# Patient Record
Sex: Female | Born: 1990 | Race: White | Hispanic: No | Marital: Single | Smoking: Never smoker
Health system: Southern US, Community
[De-identification: ages and names within clinical notes are randomized; demographics above are authoritative.]

## PROBLEM LIST (undated history)

## (undated) DIAGNOSIS — J45909 Unspecified asthma, uncomplicated: Secondary | ICD-10-CM

## (undated) DIAGNOSIS — R51 Headache: Secondary | ICD-10-CM

## (undated) DIAGNOSIS — S62609A Fracture of unspecified phalanx of unspecified finger, initial encounter for closed fracture: Secondary | ICD-10-CM

## (undated) HISTORY — PX: TYMPANOSTOMY TUBE PLACEMENT: SHX32

## (undated) HISTORY — PX: WISDOM TOOTH EXTRACTION: SHX21

---
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
## 2012-09-22 ENCOUNTER — Other Ambulatory Visit: Payer: Self-pay | Admitting: Otolaryngology

## 2012-09-22 DIAGNOSIS — J343 Hypertrophy of nasal turbinates: Secondary | ICD-10-CM

## 2012-10-10 ENCOUNTER — Ambulatory Visit
Admission: RE | Admit: 2012-10-10 | Discharge: 2012-10-10 | Disposition: A | Discharge status: Home / Self Care | Payer: BC Managed Care – PPO | Source: Ambulatory Visit | Attending: Otolaryngology | Admitting: Otolaryngology

## 2012-10-10 DIAGNOSIS — J343 Hypertrophy of nasal turbinates: Secondary | ICD-10-CM

## 2013-09-05 ENCOUNTER — Encounter (HOSPITAL_BASED_OUTPATIENT_CLINIC_OR_DEPARTMENT_OTHER): Payer: Self-pay | Admitting: *Deleted

## 2013-09-05 ENCOUNTER — Other Ambulatory Visit: Payer: Self-pay | Admitting: Orthopedic Surgery

## 2013-09-05 NOTE — H&P (Signed)
  [name] is an 23 y.o. female.   Chief Complaint: c/o injury to her right long finger ZOX:WRUEAVWHPI:Patient is a 23 y/o female who sustained an injury to her right long finger on 09/01/13 when she was leading her horse. She noted continued swelling and pain of the digit. She now presents to our office for evaluation and treatment.  Past Medical History  Diagnosis Date  . Headache(784.0)   . Fracture, finger     right long finger  . Asthma     as child    Past Surgical History  Procedure Laterality Date  . Tympanostomy tube placement    . Wisdom tooth extraction      History reviewed. No pertinent family history. Social History:  reports that she has never smoked. She has never used smokeless tobacco. She reports that she drinks alcohol. Her drug history is not on file.  Allergies:  Allergies  Allergen Reactions  . Suprax [Cefixime] Rash    No prescriptions prior to admission    No results found for this or any previous visit (from the past 48 hour(s)).  No results found.   Pertinent items are noted in HPI.  Height 5\' 9"  (1.753 m), weight 68.04 kg (150 lb), last menstrual period 08/30/2013.  General appearance: alert Head: Normocephalic, without obvious abnormality Neck: supple, symmetrical, trachea midline Resp: clear to auscultation bilaterally Cardio: regular rate and rhythm GI: normal findings: bowel sounds normal Extremities:Exam of the right long finger reveals swelling of the middle phalanx.There is no mal-alignment distally. N/V is intact. X-rays revealed a long spiral oblique fracture of the middle phalanx that is slightly displaced Pulses: 2+ and symmetric Skin: normal Neurologic: Grossly normal    Assessment/Plan Impression: Right long finger middle phalanx fracture, displaced, unstable and causing extensor lag at DIP joint.  Plan: To the OR for ORIF right long finger middle phalanx fracture.The procedure, risks,benefits and post-op course were  discussed with the patient at length and they were in agreement with the plan.  DASNOIT,Amilyah Nack J 09/05/2013, 3:57 PM  H&P documentation: 09/06/2013  -History and Physical Reviewed  -Patient has been re-examined  -No change in the plan of care  Wyn Forsterobert V Lakeithia Rasor Jr, MD

## 2013-09-06 ENCOUNTER — Ambulatory Visit (HOSPITAL_BASED_OUTPATIENT_CLINIC_OR_DEPARTMENT_OTHER)
Admission: RE | Admit: 2013-09-06 | Discharge: 2013-09-06 | Disposition: A | Discharge status: Home / Self Care | Payer: BC Managed Care – PPO | Source: Ambulatory Visit | Attending: Orthopedic Surgery | Admitting: Orthopedic Surgery

## 2013-09-06 ENCOUNTER — Encounter (HOSPITAL_BASED_OUTPATIENT_CLINIC_OR_DEPARTMENT_OTHER): Payer: BC Managed Care – PPO | Admitting: Anesthesiology

## 2013-09-06 ENCOUNTER — Ambulatory Visit (HOSPITAL_BASED_OUTPATIENT_CLINIC_OR_DEPARTMENT_OTHER): Payer: BC Managed Care – PPO | Admitting: Anesthesiology

## 2013-09-06 ENCOUNTER — Encounter (HOSPITAL_BASED_OUTPATIENT_CLINIC_OR_DEPARTMENT_OTHER)
Admission: RE | Disposition: A | Discharge status: Home / Self Care | Payer: Self-pay | Source: Ambulatory Visit | Attending: Orthopedic Surgery

## 2013-09-06 ENCOUNTER — Encounter (HOSPITAL_BASED_OUTPATIENT_CLINIC_OR_DEPARTMENT_OTHER): Payer: Self-pay | Admitting: *Deleted

## 2013-09-06 DIAGNOSIS — IMO0002 Reserved for concepts with insufficient information to code with codable children: Secondary | ICD-10-CM | POA: Insufficient documentation

## 2013-09-06 DIAGNOSIS — X58XXXA Exposure to other specified factors, initial encounter: Secondary | ICD-10-CM | POA: Insufficient documentation

## 2013-09-06 DIAGNOSIS — R51 Headache: Secondary | ICD-10-CM | POA: Insufficient documentation

## 2013-09-06 HISTORY — DX: Headache: R51

## 2013-09-06 HISTORY — PX: OPEN REDUCTION INTERNAL FIXATION (ORIF) PROXIMAL PHALANX: SHX6235

## 2013-09-06 HISTORY — DX: Unspecified asthma, uncomplicated: J45.909

## 2013-09-06 HISTORY — DX: Fracture of unspecified phalanx of unspecified finger, initial encounter for closed fracture: S62.609A

## 2013-09-06 SURGERY — OPEN REDUCTION INTERNAL FIXATION (ORIF) PROXIMAL PHALANX
Anesthesia: General | Site: Finger | Laterality: Right

## 2013-09-06 MED ORDER — PROPOFOL 10 MG/ML IV BOLUS
INTRAVENOUS | Status: DC | PRN
  Administered 2013-09-06: 150 mg via INTRAVENOUS

## 2013-09-06 MED ORDER — MIDAZOLAM HCL 2 MG/2ML IJ SOLN
INTRAMUSCULAR | Status: AC
  Filled 2013-09-06: qty 2

## 2013-09-06 MED ORDER — DOXYCYCLINE HYCLATE 100 MG PO TABS: 100.0000 mg | ORAL_TABLET | Freq: Two times a day (BID) | ORAL | Status: AC

## 2013-09-06 MED ORDER — HYDROMORPHONE HCL PF 1 MG/ML IJ SOLN
INTRAMUSCULAR | Status: AC
  Filled 2013-09-06: qty 1

## 2013-09-06 MED ORDER — HYDROMORPHONE HCL PF 1 MG/ML IJ SOLN
0.2500 mg | INTRAMUSCULAR | Status: DC | PRN
  Administered 2013-09-06: 0.5 mg via INTRAVENOUS

## 2013-09-06 MED ORDER — FENTANYL CITRATE 0.05 MG/ML IJ SOLN: 50.0000 ug | INTRAMUSCULAR | Status: DC | PRN

## 2013-09-06 MED ORDER — DEXAMETHASONE SODIUM PHOSPHATE 10 MG/ML IJ SOLN
INTRAMUSCULAR | Status: DC | PRN
  Administered 2013-09-06: 10 mg via INTRAVENOUS

## 2013-09-06 MED ORDER — OXYCODONE-ACETAMINOPHEN 5-325 MG PO TABS: 1.0000 | ORAL_TABLET | ORAL | Status: DC | PRN

## 2013-09-06 MED ORDER — FENTANYL CITRATE 0.05 MG/ML IJ SOLN
INTRAMUSCULAR | Status: DC | PRN
  Administered 2013-09-06 (×2): 25 ug via INTRAVENOUS
  Administered 2013-09-06: 100 ug via INTRAVENOUS
  Administered 2013-09-06: 50 ug via INTRAVENOUS

## 2013-09-06 MED ORDER — HYDROMORPHONE HCL 2 MG PO TABS: ORAL_TABLET | ORAL | Status: AC

## 2013-09-06 MED ORDER — CHLORHEXIDINE GLUCONATE 4 % EX LIQD: 60.0000 mL | Freq: Once | CUTANEOUS | Status: DC

## 2013-09-06 MED ORDER — CEFAZOLIN SODIUM-DEXTROSE 2-3 GM-% IV SOLR
INTRAVENOUS | Status: DC | PRN
  Administered 2013-09-06: 2 g via INTRAVENOUS

## 2013-09-06 MED ORDER — CEFAZOLIN SODIUM-DEXTROSE 2-3 GM-% IV SOLR
INTRAVENOUS | Status: AC
  Filled 2013-09-06: qty 50

## 2013-09-06 MED ORDER — ONDANSETRON HCL 4 MG/2ML IJ SOLN
INTRAMUSCULAR | Status: DC | PRN
  Administered 2013-09-06: 4 mg via INTRAVENOUS

## 2013-09-06 MED ORDER — MIDAZOLAM HCL 5 MG/5ML IJ SOLN
INTRAMUSCULAR | Status: DC | PRN
  Administered 2013-09-06: 2 mg via INTRAVENOUS

## 2013-09-06 MED ORDER — ONDANSETRON HCL 4 MG/2ML IJ SOLN: 4.0000 mg | Freq: Once | INTRAMUSCULAR | Status: DC | PRN

## 2013-09-06 MED ORDER — OXYCODONE HCL 5 MG/5ML PO SOLN: 5.0000 mg | Freq: Once | ORAL | Status: DC | PRN

## 2013-09-06 MED ORDER — LIDOCAINE HCL 2 % IJ SOLN
INTRAMUSCULAR | Status: DC | PRN
  Administered 2013-09-06: 2 mL

## 2013-09-06 MED ORDER — MIDAZOLAM HCL 2 MG/2ML IJ SOLN: 1.0000 mg | INTRAMUSCULAR | Status: DC | PRN

## 2013-09-06 MED ORDER — OXYCODONE HCL 5 MG PO TABS: 5.0000 mg | ORAL_TABLET | Freq: Once | ORAL | Status: DC | PRN

## 2013-09-06 MED ORDER — FENTANYL CITRATE 0.05 MG/ML IJ SOLN
INTRAMUSCULAR | Status: AC
  Filled 2013-09-06: qty 6

## 2013-09-06 MED ORDER — LACTATED RINGERS IV SOLN
INTRAVENOUS | Status: DC
  Administered 2013-09-06 (×3): via INTRAVENOUS

## 2013-09-06 MED ORDER — LIDOCAINE HCL (CARDIAC) 20 MG/ML IV SOLN
INTRAVENOUS | Status: DC | PRN
  Administered 2013-09-06: 60 mg via INTRAVENOUS

## 2013-09-06 SURGICAL SUPPLY — 78 items
BANDAGE ADH SHEER 1  50/CT (GAUZE/BANDAGES/DRESSINGS) IMPLANT
BANDAGE ELASTIC 3 VELCRO ST LF (GAUZE/BANDAGES/DRESSINGS) ×3 IMPLANT
BANDAGE ELASTIC 4 VELCRO ST LF (GAUZE/BANDAGES/DRESSINGS) IMPLANT
BIT DRILL 1.3 (BIT) ×1
BIT DRILL 1.3MM (BIT) IMPLANT
BIT DRILL 10 (BIT) ×1
BIT DRILL 10MM (BIT) IMPLANT
BLADE MINI RND TIP GREEN BEAV (BLADE) IMPLANT
BLADE SURG 15 STRL LF DISP TIS (BLADE) ×1 IMPLANT
BLADE SURG 15 STRL SS (BLADE) ×9
BNDG CMPR 9X4 STRL LF SNTH (GAUZE/BANDAGES/DRESSINGS) ×1
BNDG CMPR MD 5X2 ELC HKLP STRL (GAUZE/BANDAGES/DRESSINGS)
BNDG COHESIVE 1X5 TAN STRL LF (GAUZE/BANDAGES/DRESSINGS) IMPLANT
BNDG COHESIVE 3X5 TAN STRL LF (GAUZE/BANDAGES/DRESSINGS) IMPLANT
BNDG COHESIVE 4X5 TAN STRL (GAUZE/BANDAGES/DRESSINGS) IMPLANT
BNDG ELASTIC 2 VLCR STRL LF (GAUZE/BANDAGES/DRESSINGS) IMPLANT
BNDG ESMARK 4X9 LF (GAUZE/BANDAGES/DRESSINGS) ×2 IMPLANT
BNDG GAUZE ELAST 4 BULKY (GAUZE/BANDAGES/DRESSINGS) IMPLANT
BRUSH SCRUB EZ PLAIN DRY (MISCELLANEOUS) ×3 IMPLANT
CANISTER SUCT 1200ML W/VALVE (MISCELLANEOUS) ×3 IMPLANT
CLOSURE WOUND 1/2 X4 (GAUZE/BANDAGES/DRESSINGS)
CORDS BIPOLAR (ELECTRODE) ×3 IMPLANT
COVER MAYO STAND STRL (DRAPES) ×3 IMPLANT
COVER TABLE BACK 60X90 (DRAPES) ×3 IMPLANT
CUFF TOURNIQUET SINGLE 18IN (TOURNIQUET CUFF) ×2 IMPLANT
DECANTER SPIKE VIAL GLASS SM (MISCELLANEOUS) ×2 IMPLANT
DRAPE EXTREMITY T 121X128X90 (DRAPE) ×3 IMPLANT
DRAPE OEC MINIVIEW 54X84 (DRAPES) ×3 IMPLANT
DRAPE SURG 17X23 STRL (DRAPES) ×3 IMPLANT
DRILL BIT 1.3MM (BIT) ×3
DRILL BIT 10MM (BIT) ×3
GLOVE BIOGEL M STRL SZ7.5 (GLOVE) ×3 IMPLANT
GLOVE BIOGEL PI IND STRL 7.0 (GLOVE) IMPLANT
GLOVE BIOGEL PI INDICATOR 7.0 (GLOVE) ×2
GLOVE ECLIPSE 6.5 STRL STRAW (GLOVE) ×2 IMPLANT
GLOVE ORTHO TXT STRL SZ7.5 (GLOVE) ×3 IMPLANT
GOWN STRL REUS W/ TWL LRG LVL3 (GOWN DISPOSABLE) ×1 IMPLANT
GOWN STRL REUS W/ TWL XL LVL3 (GOWN DISPOSABLE) ×2 IMPLANT
GOWN STRL REUS W/TWL LRG LVL3 (GOWN DISPOSABLE) ×3
GOWN STRL REUS W/TWL XL LVL3 (GOWN DISPOSABLE) ×6
K-WIRE PROS .028 4 (WIRE) ×2 IMPLANT
NEEDLE 27GAX1X1/2 (NEEDLE) ×2 IMPLANT
NS IRRIG 1000ML POUR BTL (IV SOLUTION) ×3 IMPLANT
PACK BASIN DAY SURGERY FS (CUSTOM PROCEDURE TRAY) ×3 IMPLANT
PAD CAST 3X4 CTTN HI CHSV (CAST SUPPLIES) ×1 IMPLANT
PAD CAST 4YDX4 CTTN HI CHSV (CAST SUPPLIES) IMPLANT
PADDING CAST ABS 4INX4YD NS (CAST SUPPLIES)
PADDING CAST ABS COTTON 4X4 ST (CAST SUPPLIES) ×1 IMPLANT
PADDING CAST COTTON 3X4 STRL (CAST SUPPLIES) ×3
PADDING CAST COTTON 4X4 STRL (CAST SUPPLIES)
PADDING UNDERCAST 2  STERILE (CAST SUPPLIES) IMPLANT
SCREW SELF TAP CORTEX 1.3 10MM (Screw) ×2 IMPLANT
SCREW SELF TAP CORTEX 1.3 8MM (Screw) ×4 IMPLANT
SLEEVE SCD COMPRESS KNEE MED (MISCELLANEOUS) ×2 IMPLANT
SPLINT FINGER 5.25 ALUM (CAST SUPPLIES) ×3
SPLINT FINGER 5/8X5.25 (CAST SUPPLIES) IMPLANT
SPLINT PLASTER CAST XFAST 3X15 (CAST SUPPLIES) IMPLANT
SPLINT PLASTER XTRA FASTSET 3X (CAST SUPPLIES)
SPONGE GAUZE 4X4 12PLY (GAUZE/BANDAGES/DRESSINGS) ×3 IMPLANT
STOCKINETTE 4X48 STRL (DRAPES) ×3 IMPLANT
STRIP CLOSURE SKIN 1/2X4 (GAUZE/BANDAGES/DRESSINGS) IMPLANT
SUCTION FRAZIER TIP 10 FR DISP (SUCTIONS) IMPLANT
SUT ETHILON 4 0 PS 2 18 (SUTURE) IMPLANT
SUT MERSILENE 4 0 P 3 (SUTURE) IMPLANT
SUT PROLENE 3 0 PS 2 (SUTURE) IMPLANT
SUT PROLENE 4 0 P 3 18 (SUTURE) IMPLANT
SUT STEEL 0 (SUTURE)
SUT STEEL 0 18XMFL TIE 17 (SUTURE) IMPLANT
SUT VIC AB 4-0 P-3 18XBRD (SUTURE) IMPLANT
SUT VIC AB 4-0 P3 18 (SUTURE)
SYR 3ML 23GX1 SAFETY (SYRINGE) IMPLANT
SYR BULB 3OZ (MISCELLANEOUS) ×3 IMPLANT
SYR CONTROL 10ML LL (SYRINGE) ×2 IMPLANT
TOWEL OR 17X24 6PK STRL BLUE (TOWEL DISPOSABLE) ×3 IMPLANT
TRAY DSU PREP LF (CUSTOM PROCEDURE TRAY) ×3 IMPLANT
TUBE CONNECTING 20'X1/4 (TUBING) ×1
TUBE CONNECTING 20X1/4 (TUBING) ×1 IMPLANT
UNDERPAD 30X30 INCONTINENT (UNDERPADS AND DIAPERS) ×3 IMPLANT

## 2013-09-06 NOTE — Brief Op Note (Signed)
09/06/2013  5:52 PM  PATIENT:  [name]  23 y.o. female  PRE-OPERATIVE DIAGNOSIS:  FRACTURE RIGHT LONG FINGER  POST-OPERATIVE DIAGNOSIS:  Fracture Right Long Finger  PROCEDURE:  Procedure(s): OPEN REDUCTION INTERNAL FIXATION (ORIF) RIGHT LONG FINGER (Right)  SURGEON:  Surgeon(s) and Role:    * Wyn Forsterobert V Serafino Burciaga Jr., MD - Primary  PHYSICIAN ASSISTANT:   ASSISTANTS: Mallory Shirkobert Dasnoit,P.A-C    ANESTHESIA:   general  EBL:  Total I/O In: 2200 [I.V.:2200] Out: -   BLOOD ADMINISTERED:none  DRAINS: none   LOCAL MEDICATIONS USED:  LIDOCAINE   SPECIMEN:  No Specimen  DISPOSITION OF SPECIMEN:  N/A  COUNTS:  YES  TOURNIQUET:   Total Tourniquet Time Documented: Upper Arm (Right) - 82 minutes Total: Upper Arm (Right) - 82 minutes   DICTATION: .Other Dictation: Dictation Number [id number]  PLAN OF CARE: Discharge to home after PACU  PATIENT DISPOSITION:  PACU - hemodynamically stable.   Delay start of Pharmacological VTE agent (>24hrs) due to surgical blood loss or risk of bleeding: not applicable

## 2013-09-06 NOTE — Op Note (Signed)
[id number] 

## 2013-09-06 NOTE — Anesthesia Preprocedure Evaluation (Signed)
Anesthesia Evaluation  Patient identified by MRN, date of birth, ID band Patient awake    Reviewed: Allergy & Precautions, H&P , NPO status , Patient's Chart, lab work & pertinent test results  Airway Mallampati: I TM Distance: >3 FB Neck ROM: Full    Dental  (+) Teeth Intact, Dental Advisory Given   Pulmonary asthma ,  breath sounds clear to auscultation        Cardiovascular Rhythm:Regular Rate:Normal     Neuro/Psych    GI/Hepatic   Endo/Other    Renal/GU      Musculoskeletal   Abdominal   Peds  Hematology   Anesthesia Other Findings   Reproductive/Obstetrics                           Anesthesia Physical Anesthesia Plan  ASA: II  Anesthesia Plan: General   Post-op Pain Management:    Induction: Intravenous  Airway Management Planned: LMA  Additional Equipment:   Intra-op Plan:   Post-operative Plan: Extubation in OR  Informed Consent: I have reviewed the patients History and Physical, chart, labs and discussed the procedure including the risks, benefits and alternatives for the proposed anesthesia with the patient or authorized representative who has indicated his/her understanding and acceptance.   Dental advisory given  Plan Discussed with: CRNA, Anesthesiologist and Surgeon  Anesthesia Plan Comments:         Anesthesia Quick Evaluation  

## 2013-09-06 NOTE — Anesthesia Procedure Notes (Signed)
Procedure Name: LMA Insertion Date/Time: 09/06/2013 4:06 PM Performed by: Burna CashONRAD, Kenlynn Houde C Pre-anesthesia Checklist: Patient identified, Emergency Drugs available, Suction available and Patient being monitored Patient Re-evaluated:Patient Re-evaluated prior to inductionOxygen Delivery Method: Circle System Utilized Preoxygenation: Pre-oxygenation with 100% oxygen Intubation Type: IV induction Ventilation: Mask ventilation without difficulty LMA: LMA inserted LMA Size: 4.0 Number of attempts: 1 Airway Equipment and Method: bite block Placement Confirmation: positive ETCO2 Tube secured with: Tape Dental Injury: Teeth and Oropharynx as per pre-operative assessment

## 2013-09-06 NOTE — Transfer of Care (Signed)
Immediate Anesthesia Transfer of Care Note  Patient: Cheryl SieveHeather E Conger  Procedure(s) Performed: Procedure(s): OPEN REDUCTION INTERNAL FIXATION (ORIF) RIGHT LONG FINGER (Right)  Patient Location: PACU  Anesthesia Type:General  Level of Consciousness: awake, alert  and oriented  Airway & Oxygen Therapy: Patient Spontanous Breathing and Patient connected to face mask oxygen  Post-op Assessment: Report given to PACU RN and Post -op Vital signs reviewed and stable  Post vital signs: Reviewed and stable  Complications: No apparent anesthesia complications

## 2013-09-06 NOTE — Discharge Instructions (Addendum)
°  Post Anesthesia Home Care Instructions  Activity: Get plenty of rest for the remainder of the day. A responsible adult should stay with you for 24 hours following the procedure.  For the next 24 hours, DO NOT: -Drive a car -Advertising copywriterperate machinery -Drink alcoholic beverages -Take any medication unless instructed by your physician -Make any legal decisions or sign important papers.  Meals: Start with liquid foods such as gelatin or soup. Progress to regular foods as tolerated. Avoid greasy, spicy, heavy foods. If nausea and/or vomiting occur, drink only clear liquids until the nausea and/or vomiting subsides. Call your physician if vomiting continues.  Special Instructions/Symptoms: Your throat may feel dry or sore from the anesthesia or the breathing tube placed in your throat during surgery. If this causes discomfort, gargle with warm salt water. The discomfort should disappear within 24 hours.             HAND SURGERY    HOME CARE INSTRUCTIONS    The following instructions have been prepared to help you care for yourself upon your return home today.  Wound Care:  Keep your hand elevated above the level of your heart. Do not allow it to dangle by your side. Keep the dressing dry and do not remove it unless your doctor advises you to do so. He will usually change it at the time of you post-op visit. Moving your fingers is advised to stimulate circulation but will depend on the site of your surgery. Of course, if you have a splint applied your doctor will advise you about movement.  Activity:  Do not drive or operate machinery today. Rest today and then you may return to your normal activity and work as indicated by your physician.  Diet: Drink liquids today or eat a light diet. You may resume a regular diet tomorrow.  General expectations: Pain for two or three days. Fingers may become slightly swollen.   Unexpected Observations- Call your doctor if any of these occur: Severe  pain not relieved by pain medication. Elevated temperature. Dressing soaked with blood. Inability to move fingers. White or bluish color to fingers.  Return to office: ***           Elevate right hand over heart for 4 days.  Keep dressing clean and dry.  Call for any difficulties with dressing or medications.

## 2013-09-07 ENCOUNTER — Encounter (HOSPITAL_BASED_OUTPATIENT_CLINIC_OR_DEPARTMENT_OTHER): Payer: Self-pay | Admitting: Orthopedic Surgery

## 2013-09-07 LAB — POCT HEMOGLOBIN-HEMACUE: Hemoglobin: 13.2 g/dL (ref 12.0–15.0)

## 2013-09-07 NOTE — Anesthesia Postprocedure Evaluation (Signed)
  Anesthesia Post-op Note  Patient: [name]  Procedure(s) Performed: Procedure(s): OPEN REDUCTION INTERNAL FIXATION (ORIF) RIGHT LONG FINGER (Right)  Patient Location: PACU  Anesthesia Type:General  Level of Consciousness: awake and alert   Airway and Oxygen Therapy: Patient Spontanous Breathing  Post-op Pain: mild  Post-op Assessment: Post-op Vital signs reviewed, Patient's Cardiovascular Status Stable and Respiratory Function Stable  Post-op Vital Signs: Reviewed  Filed Vitals:   09/06/13 1945  BP: 125/82  Pulse: 79  Temp: 36.6 C  Resp: 20    Complications: No apparent anesthesia complications

## 2013-09-07 NOTE — Op Note (Signed)
NAMCecilie Webb:  Webb, [name]             ACCOUNT NO.:  [id number]  MEDICAL RECORD NO.:  [id number]  LOCATION:                                 FACILITY:  PHYSICIAN:  Katy Fitchobert V. Sheilah Rayos, M.D.      DATE OF BIRTH:  DATE OF PROCEDURE:  09/06/2013 DATE OF DISCHARGE:                              OPERATIVE REPORT   PREOPERATIVE DIAGNOSIS:  Displaced i.e. angulated, malrotated, and shortened spiral oblique fracture of right long finger, middle phalanx, with 15-degree extensor lag of distal interphalangeal joint.  POSTOPERATIVE DIAGNOSIS:  Displaced i.e. angulated, malrotated, and shortened spiral oblique fracture of right long finger, middle phalanx, with 15-degree extensor lag of distal interphalangeal joint, with identification of plastic deformation of proximal fragment, rendering anatomic reduction very challenging.  OPERATION:  Open reduction and internal fixation of a right long finger, middle phalangeal spiral oblique fracture with plastic deformation.  OPERATING SURGEON:  Katy Fitchobert V. Cassara Nida, MD  ASSISTANT:  Cheryl Reeksobert J. Dasnoit, PA  ANESTHESIA:  General by LMA.  SUPERVISING ANESTHESIOLOGIST:  Cheryl MayoW. Edmond Fitzgerald, MD.  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  While tending to her horse on September 01, 2013, she was involved in an altercation when the horse bolted and her finger was jammed up against some of the hardware on the horse's lead.  She sustained a severe twisting injury to her right long finger.  She had immediate swelling ecchymosis and deformity of the finger.  She was brought by her dad, Dr. Illa Levelobin Webb, one of our esteemed primary care colleagues, for evaluation of her hand on September 05, 2013.  We noted the extensor lag at her DIP joint and marked ecchymosis.  Four views of her finger demonstrated a spiral oblique fracture of the right long finger middle phalanx with shortening, malrotation,  and extensor lag was documented at the distal interphalangeal joint.  A detailed informed consent with [name] and her dad, recommending that she undergo open reduction and internal fixation to anatomically reduce the fracture and correct her extensor lag.  While this is easy in conception, execution in the small bones with high- energy injuries can be quite problematic.  After detailed informed consent, she was brought to the operating room at this time.  Dr. Sampson Webb provided anesthesia informed consent, recommending general anesthesia by LMA technique.  DESCRIPTION OF PROCEDURE:  [name] was interviewed in the holding area and her right long finger marked per protocol with a marking pen as the proper surgical site.  [facility] under Dr. Jarrett Webb's direct supervision, general anesthesia by LMA technique was induced.  2 g of Ancef were administered as an IV prophylactic antibiotic followed by routine Betadine scrub and paint of the right upper extremity. Sterile stockinette and impervious arthroscopy drapes were applied.  With the aid of a C-arm fluoroscope, we attempted to close reduce the fracture.  This was not possible.  The distal fragment had rotated and it appeared that the fragments might be bound on the ulnar collateral ligament at the DIP joint.  The fracture clearly involved the origin of the ulnar collateral ligament at  the DIP joint on the metacarpal head. We subsequently performed a curvilinear incision on the dorsum of the finger that would allow proper placement of screws from radial to ulna. We carefully incised the periosteum in the central aspect of the joint without significantly disturbing the extensor mechanism and exposed the fracture site.  We had quite a challenge trying to reduce the fracture anatomically.  After elevation of periosteum, we could easily regain length.  We could never  perfectly interdigitate the distal fragment against the proximal phalanx at the head of the middle phalanx.  We cleared the fracture site of all clot and debris, irrigated the fracture and carefully released periosteum and the collateral ligament origin.  Despite all these maneuvers, the fracture would not adequately interdigitate leading to the realization that there was plastic deformation of the proximal fragment distal aspect.  Ultimately, we fixed the proximal aspect of the fracture with a 0.028-inch Kirschner wire and then walked a series of hemostats distally, essentially re-shaping the bone, obtaining a virtually anatomic result.  With great care, we then placed two 1.3-mm ASIF stainless steel lag screws, compressing the fracture site.  A 99% anatomic reduction was achieved with very slight gap distally, but complete recovery of the proper length of the middle phalanx and correction of the extensor lag at the DIP joint.  C-arm fluoroscopic images of the reduction and screw position were obtained.  The wound was then thoroughly irrigated with sterile saline followed by repair of the periosteum with a running suture of 4-0 Vicryl.  The skin was repaired with a few subcutaneous 4-0 Vicryl and segmental intradermal 4-0 Prolene sutures.  [name] was placed in compressive dressing with her finger immobilized with Alumafoam splint anchored at her wrist.  There were no apparent complications other than the challenge of plastic deformation of this phalangeal fracture.  For aftercare, [name] was provided prescriptions for Dilaudid 2 mg 1 to 2 tablets p.o. q.4-6 hours p.r.n. pain, 30 tablets without refill.  She was also provided doxycycline 100 mg p.o. b.i.d. x4 days as a prophylactic antibiotic.     Katy Fitch [name], M.D.     RVS/MEDQ  D:  09/06/2013  T:  09/07/2013  Job:  [id number]

## 2016-05-18 ENCOUNTER — Other Ambulatory Visit: Payer: Self-pay | Admitting: Family Medicine

## 2016-05-18 DIAGNOSIS — E010 Iodine-deficiency related diffuse (endemic) goiter: Secondary | ICD-10-CM

## 2016-05-27 ENCOUNTER — Ambulatory Visit
Admission: RE | Admit: 2016-05-27 | Discharge: 2016-05-27 | Disposition: A | Discharge status: Home / Self Care | Payer: Self-pay | Source: Ambulatory Visit | Attending: Family Medicine | Admitting: Family Medicine

## 2016-05-27 DIAGNOSIS — E010 Iodine-deficiency related diffuse (endemic) goiter: Secondary | ICD-10-CM

## 2017-05-04 ENCOUNTER — Other Ambulatory Visit: Payer: Self-pay | Admitting: Family Medicine

## 2017-05-04 DIAGNOSIS — E041 Nontoxic single thyroid nodule: Secondary | ICD-10-CM

## 2017-05-09 ENCOUNTER — Ambulatory Visit
Admission: RE | Admit: 2017-05-09 | Discharge: 2017-05-09 | Disposition: A | Discharge status: Home / Self Care | Payer: 59 | Source: Ambulatory Visit | Attending: Family Medicine | Admitting: Family Medicine

## 2017-05-09 DIAGNOSIS — E041 Nontoxic single thyroid nodule: Secondary | ICD-10-CM

## 2017-05-13 ENCOUNTER — Other Ambulatory Visit: Payer: Self-pay | Admitting: Family Medicine

## 2017-05-13 DIAGNOSIS — R109 Unspecified abdominal pain: Secondary | ICD-10-CM

## 2017-05-16 ENCOUNTER — Ambulatory Visit
Admission: RE | Admit: 2017-05-16 | Discharge: 2017-05-16 | Disposition: A | Discharge status: Home / Self Care | Payer: 59 | Source: Ambulatory Visit | Attending: Family Medicine | Admitting: Family Medicine

## 2017-05-16 DIAGNOSIS — R109 Unspecified abdominal pain: Secondary | ICD-10-CM

## 2017-05-16 MED ORDER — IOPAMIDOL (ISOVUE-300) INJECTION 61%
100.0000 mL | Freq: Once | INTRAVENOUS | Status: AC | PRN
  Administered 2017-05-16: 100 mL via INTRAVENOUS

## 2018-05-29 ENCOUNTER — Emergency Department (HOSPITAL_BASED_OUTPATIENT_CLINIC_OR_DEPARTMENT_OTHER)
Admission: EM | Admit: 2018-05-29 | Discharge: 2018-05-29 | Disposition: A | Discharge status: Home / Self Care | Payer: 59 | Attending: Emergency Medicine | Admitting: Emergency Medicine

## 2018-05-29 ENCOUNTER — Emergency Department (HOSPITAL_BASED_OUTPATIENT_CLINIC_OR_DEPARTMENT_OTHER): Payer: 59

## 2018-05-29 ENCOUNTER — Encounter (HOSPITAL_BASED_OUTPATIENT_CLINIC_OR_DEPARTMENT_OTHER): Payer: Self-pay | Admitting: *Deleted

## 2018-05-29 ENCOUNTER — Other Ambulatory Visit: Payer: Self-pay

## 2018-05-29 DIAGNOSIS — Z79899 Other long term (current) drug therapy: Secondary | ICD-10-CM | POA: Diagnosis not present

## 2018-05-29 DIAGNOSIS — J45901 Unspecified asthma with (acute) exacerbation: Secondary | ICD-10-CM | POA: Diagnosis not present

## 2018-05-29 DIAGNOSIS — R0602 Shortness of breath: Secondary | ICD-10-CM | POA: Diagnosis present

## 2018-05-29 LAB — BASIC METABOLIC PANEL
ANION GAP: 11 (ref 5–15)
BUN: 13 mg/dL (ref 6–20)
CALCIUM: 9.1 mg/dL (ref 8.9–10.3)
CO2: 24 mmol/L (ref 22–32)
CREATININE: 0.69 mg/dL (ref 0.44–1.00)
Chloride: 100 mmol/L (ref 98–111)
Glucose, Bld: 89 mg/dL (ref 70–99)
Potassium: 3.6 mmol/L (ref 3.5–5.1)
SODIUM: 135 mmol/L (ref 135–145)

## 2018-05-29 LAB — CBC WITH DIFFERENTIAL/PLATELET
Abs Immature Granulocytes: 0.02 10*3/uL (ref 0.00–0.07)
BASOS ABS: 0 10*3/uL (ref 0.0–0.1)
Basophils Relative: 0 %
EOS ABS: 1 10*3/uL — AB (ref 0.0–0.5)
Eosinophils Relative: 11 %
HEMATOCRIT: 40 % (ref 36.0–46.0)
Hemoglobin: 13.2 g/dL (ref 12.0–15.0)
IMMATURE GRANULOCYTES: 0 %
LYMPHS ABS: 1.8 10*3/uL (ref 0.7–4.0)
LYMPHS PCT: 19 %
MCH: 30.1 pg (ref 26.0–34.0)
MCHC: 33 g/dL (ref 30.0–36.0)
MCV: 91.1 fL (ref 80.0–100.0)
Monocytes Absolute: 0.7 10*3/uL (ref 0.1–1.0)
Monocytes Relative: 7 %
NEUTROS PCT: 63 %
NRBC: 0 % (ref 0.0–0.2)
Neutro Abs: 5.7 10*3/uL (ref 1.7–7.7)
PLATELETS: 283 10*3/uL (ref 150–400)
RBC: 4.39 MIL/uL (ref 3.87–5.11)
RDW: 12.5 % (ref 11.5–15.5)
WBC: 9.2 10*3/uL (ref 4.0–10.5)

## 2018-05-29 MED ORDER — METHYLPREDNISOLONE SODIUM SUCC 125 MG IJ SOLR
125.0000 mg | Freq: Once | INTRAMUSCULAR | Status: AC
  Administered 2018-05-29: 125 mg via INTRAVENOUS
  Filled 2018-05-29: qty 2

## 2018-05-29 MED ORDER — LACTATED RINGERS IV BOLUS
1000.0000 mL | Freq: Once | INTRAVENOUS | Status: AC
  Administered 2018-05-29: 1000 mL via INTRAVENOUS

## 2018-05-29 MED ORDER — PREDNISONE 20 MG PO TABS: 60.0000 mg | ORAL_TABLET | Freq: Every day | ORAL | 0 refills | Status: AC

## 2018-05-29 MED ORDER — ACETAMINOPHEN 325 MG PO TABS
650.0000 mg | ORAL_TABLET | Freq: Once | ORAL | Status: AC | PRN
  Administered 2018-05-29: 650 mg via ORAL
  Filled 2018-05-29: qty 2

## 2018-05-29 MED ORDER — ALBUTEROL SULFATE (2.5 MG/3ML) 0.083% IN NEBU: 2.5000 mg | INHALATION_SOLUTION | Freq: Four times a day (QID) | RESPIRATORY_TRACT | 0 refills | Status: AC | PRN

## 2018-05-29 NOTE — ED Triage Notes (Signed)
Cold for a month. Sob today. Hx of asthma. After her inhaler did nothing to help she used a neb with some short term relief.

## 2018-05-29 NOTE — ED Provider Notes (Signed)
[facility] EMERGENCY DEPARTMENT Provider Note   CSN: [id number] Arrival date & time: 05/29/18  1804     History   Chief Complaint Chief Complaint  Patient presents with  . Shortness of Breath    HPI [name] is a 27 y.o. female.  The history is provided by the patient.  Shortness of Breath  This is a new problem. The problem occurs intermittently.The current episode started more than 2 days ago. The problem has been gradually improving. Associated symptoms include a fever, cough and wheezing. Pertinent negatives include no rhinorrhea, no sore throat, no ear pain, no chest pain, no vomiting, no abdominal pain and no rash. It is unknown what precipitated the problem. She has tried beta-agonist inhalers for the symptoms. The treatment provided moderate relief. She has had prior ED visits. Associated medical issues include asthma.    Past Medical History:  Diagnosis Date  . Asthma    as child  . Fracture, finger    right long finger  . Headache(784.0)     There are no active problems to display for this patient.   Past Surgical History:  Procedure Laterality Date  . OPEN REDUCTION INTERNAL FIXATION (ORIF) PROXIMAL PHALANX Right 09/06/2013   Procedure: OPEN REDUCTION INTERNAL FIXATION (ORIF) RIGHT LONG FINGER;  Surgeon: Wyn Forster., MD;  Location: [facility];  Service: Orthopedics;  Laterality: Right;  . TYMPANOSTOMY TUBE PLACEMENT    . WISDOM TOOTH EXTRACTION       OB History   None      Home Medications    Prior to Admission medications   Medication Sig Start Date End Date Taking? Authorizing Provider  albuterol (PROVENTIL HFA;VENTOLIN HFA) 108 (90 BASE) MCG/ACT inhaler Inhale into the lungs every 6 (six) hours as needed for wheezing or shortness of breath.   Yes [provider]  albuterol (PROVENTIL) (2.5 MG/3ML) 0.083% nebulizer solution Take 3 mLs (2.5 mg total) by nebulization every 6 (six) hours as needed for  up to 20 doses for wheezing or shortness of breath. 05/29/18   Olga Seyler, DO  doxycycline (VIBRA-TABS) 100 MG tablet Take 1 tablet (100 mg total) by mouth 2 (two) times daily. 09/06/13   Dasnoit, Molly Maduro, PA-C  HYDROmorphone (DILAUDID) 2 MG tablet 1 or 2 tabs every 4 hours as needed for pain 09/06/13   Dasnoit, Robert, PA-C  Levonorgestrel-Ethinyl Estradiol (SEASONIQUE) 0.15-0.03 &0.01 MG tablet Take 1 tablet by mouth daily.    [provider]  predniSONE (DELTASONE) 20 MG tablet Take 3 tablets (60 mg total) by mouth daily for 4 days. 05/29/18 06/02/18  Virgina Norfolk, DO    Family History No family history on file.  Social History Social History   Tobacco Use  . Smoking status: Never Smoker  . Smokeless tobacco: Never Used  Substance Use Topics  . Alcohol use: Yes    Comment: social  . Drug use: Not on file     Allergies   Suprax [cefixime]   Review of Systems Review of Systems  Constitutional: Positive for fever. Negative for chills.  HENT: Negative for ear pain, rhinorrhea and sore throat.   Eyes: Negative for pain and visual disturbance.  Respiratory: Positive for cough, shortness of breath and wheezing.   Cardiovascular: Negative for chest pain and palpitations.  Gastrointestinal: Negative for abdominal pain and vomiting.  Genitourinary: Negative for dysuria and hematuria.  Musculoskeletal: Negative for arthralgias and back pain.  Skin: Negative for color change and rash.  Neurological:  Negative for seizures and syncope.  All other systems reviewed and are negative.    Physical Exam Updated Vital Signs  ED Triage Vitals  Enc Vitals Group     BP 05/29/18 1814 (!) 139/92     Pulse Rate 05/29/18 1814 (!) 118     Resp 05/29/18 1814 (!) 22     Temp 05/29/18 1814 100.1 F (37.8 C)     Temp Source 05/29/18 1814 Oral     SpO2 05/29/18 1814 98 %     Weight 05/29/18 1808 155 lb (70.3 kg)     Height 05/29/18 1808 5\' 9"  (1.753 m)     Head Circumference --       Peak Flow --      Pain Score 05/29/18 1808 0     Pain Loc --      Pain Edu? --      Excl. in GC? --     Physical Exam  Constitutional: She is oriented to person, place, and time. She appears well-developed and well-nourished. No distress.  HENT:  Head: Normocephalic and atraumatic.  Eyes: Pupils are equal, round, and reactive to light. Conjunctivae and EOM are normal.  Neck: Normal range of motion. Neck supple.  Cardiovascular: Normal rate and regular rhythm.  No murmur heard. Pulmonary/Chest: Effort normal. No respiratory distress. She has wheezes in the right middle field and the right lower field.  Abdominal: Soft. There is no tenderness.  Musculoskeletal: Normal range of motion. She exhibits no edema.       Right lower leg: She exhibits no edema.       Left lower leg: She exhibits no edema.  Neurological: She is alert and oriented to person, place, and time.  Skin: Skin is warm and dry. Capillary refill takes less than 2 seconds.  Psychiatric: She has a normal mood and affect.  Nursing note and vitals reviewed.    ED Treatments / Results  Labs (all labs ordered are listed, but only abnormal results are displayed) Labs Reviewed  CBC WITH DIFFERENTIAL/PLATELET - Abnormal; Notable for the following components:      Result Value   Eosinophils Absolute 1.0 (*)    All other components within normal limits  BASIC METABOLIC PANEL    EKG EKG Interpretation  Date/Time:  Monday May 29 2018 18:12:37 EST Ventricular Rate:  128 PR Interval:    QRS Duration: 82 QT Interval:  402 QTC Calculation: 586 R Axis:   68 Text Interpretation:  Sinus tachycardia Anterior infarct , age undetermined Abnormal ECG Confirmed by Virgina Norfolk [phone]) on 05/29/2018 6:21:48 PM   Radiology Dg Chest 2 View  Result Date: 05/29/2018 CLINICAL DATA:  Shortness of breath.  Upper respiratory infection. EXAM: CHEST - 2 VIEW COMPARISON:  Overlapping portion of CT abdomen from 05/16/2017  FINDINGS: Mild central airway thickening. The lungs appear otherwise clear. Cardiac and mediastinal margins appear normal. No pleural effusion. IMPRESSION: 1. Airway thickening is present, suggesting bronchitis or reactive airways disease. Electronically Signed   By: Gaylyn Rong M.D.   On: 05/29/2018 18:31    Procedures Procedures (including critical care time)  Medications Ordered in ED Medications  lactated ringers bolus 1,000 mL (1,000 mLs Intravenous New Bag/Given 05/29/18 1833)  acetaminophen (TYLENOL) tablet 650 mg (650 mg Oral Given 05/29/18 1819)  methylPREDNISolone sodium succinate (SOLU-MEDROL) 125 mg/2 mL injection 125 mg (125 mg Intravenous Given 05/29/18 1838)     Initial Impression / Assessment and Plan / ED Course  I have reviewed the  triage vital signs and the nursing notes.  Pertinent labs & imaging results that were available during my care of the patient were reviewed by me and considered in my medical decision making (see chart for details).     This chart was dictated using voice recognition software.  Despite best efforts to proofread,  errors can occur which can change the documentation meaning.  [name] is a 27 year old female history of asthma who presents to the ED with cough, wheezing.  Patient with tachycardia, low-grade fever but otherwise unremarkable vitals.  Patient states that she has been using her inhaler every several hours for the last 2 days.  She has had fever with cough but no sputum production.  She has mild wheezing on exam.  Comfortable appearing.  No tachypnea.  No signs of volume overload.  Patient with chest x-ray that shows likely reactive airway process.  No pneumonia, no pneumothorax, no pleural effusion.  No significant anemia, electrolyte abnormality, leukocytosis.  Patient with breathing treatment just prior to arrival.  Will treat with Solu-Medrol, Tylenol, fluid bolus.  Patient felt improved following fluids.  Continued to  have no respiratory symptoms.  Recommend continued use of albuterol at home every 4 hours for the next 24 hours.  Was given prednisone prescription.  Given return precautions and recommend follow-up with primary care doctor.  Patient likely with asthma exacerbation secondary to a viral process.  This chart was dictated using voice recognition software.  Despite best efforts to proofread,  errors can occur which can change the documentation meaning.   Final Clinical Impressions(s) / ED Diagnoses   Final diagnoses:  Exacerbation of asthma, unspecified asthma severity, unspecified whether persistent    ED Discharge Orders         Ordered    albuterol (PROVENTIL) (2.5 MG/3ML) 0.083% nebulizer solution  Every 6 hours PRN     05/29/18 1925    predniSONE (DELTASONE) 20 MG tablet  Daily     05/29/18 1925           Virgina Norfolk, DO 05/29/18 1931

## 2019-02-08 IMAGING — CT CT ABD-PELV W/ CM
2 of 4 series · 11 of 36 positions shown, 18 images · IV contrast (READICAT/WATER & [ID] ISOVUE 300)
Comparison: None.

CLINICAL DATA: Right lower quadrant discomfort and pressure for the
last few weeks, worsening over the last 2 weeks.

EXAM:
CT ABDOMEN AND PELVIS WITH CONTRAST
TECHNIQUE: Multidetector CT imaging of the abdomen and pelvis was performed
using the standard protocol following bolus administration of
intravenous contrast.
CONTRAST:  100mL NTPE37-Q33 IOPAMIDOL (NTPE37-Q33) INJECTION 61%

[Series 3: abd/pelvis with · axial · 0.62mm/px · z∈[-410,-50]mm · 10 of 88 slices shown, 16 images]
[im 8/88  soft-tissue]
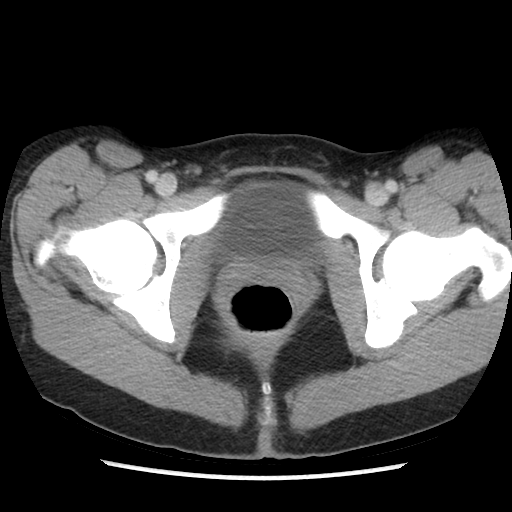
[im 8/88  bone]
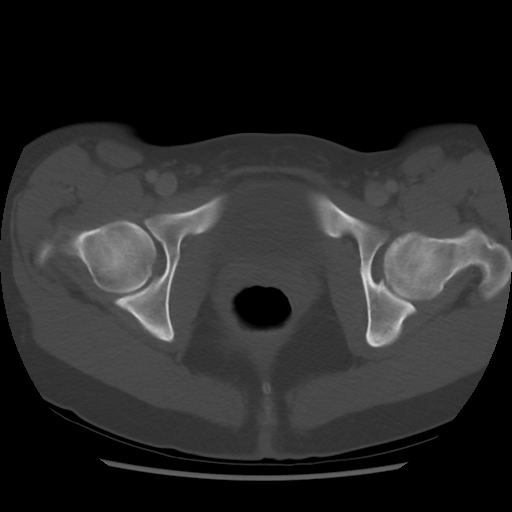
[im 16/88  soft-tissue]
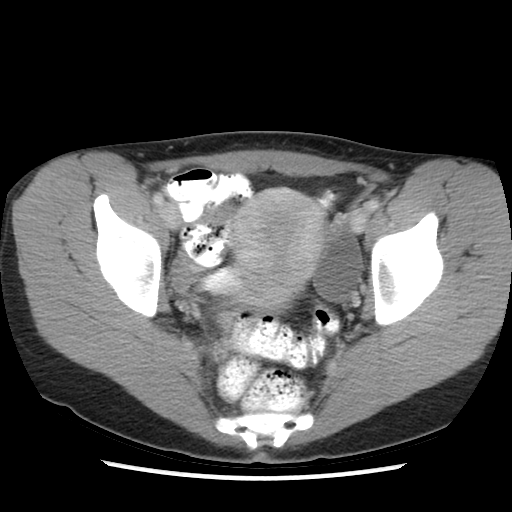
[im 24/88  soft-tissue]
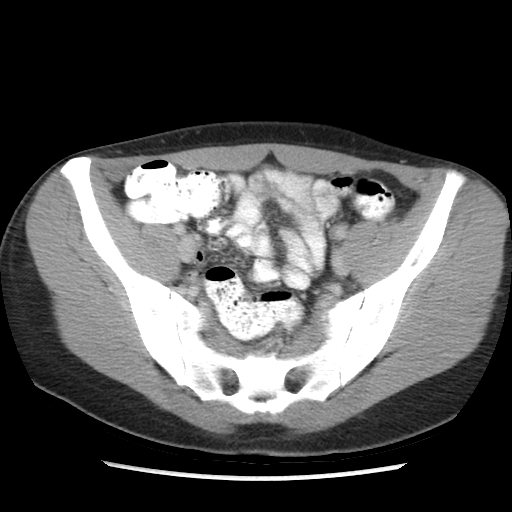
[im 32/88  soft-tissue]
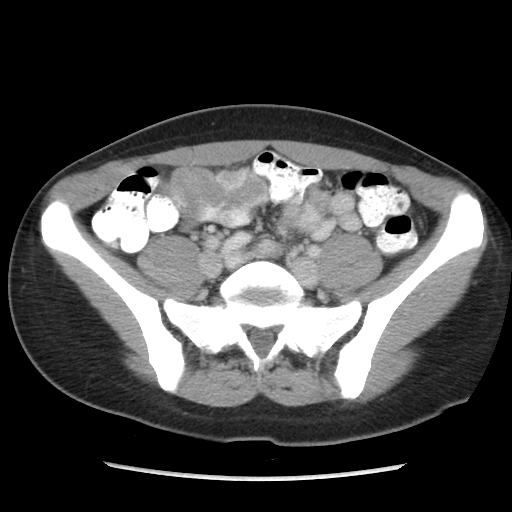
[im 40/88  soft-tissue]
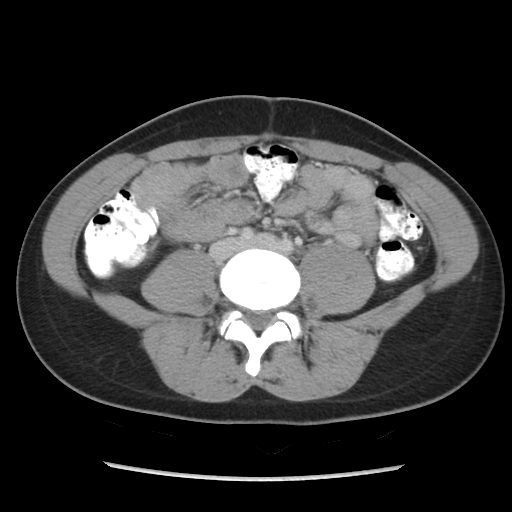
[im 48/88  soft-tissue]
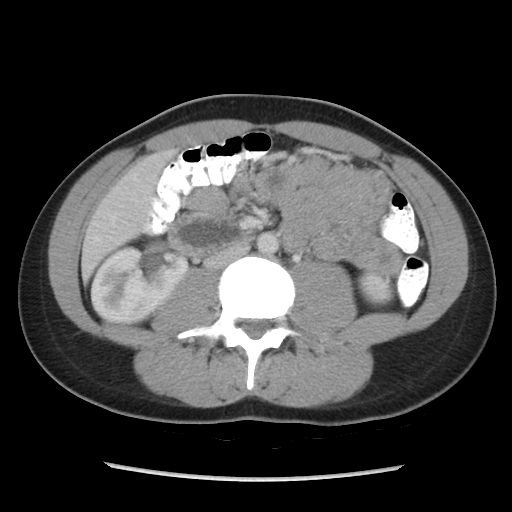
[im 56/88  soft-tissue]
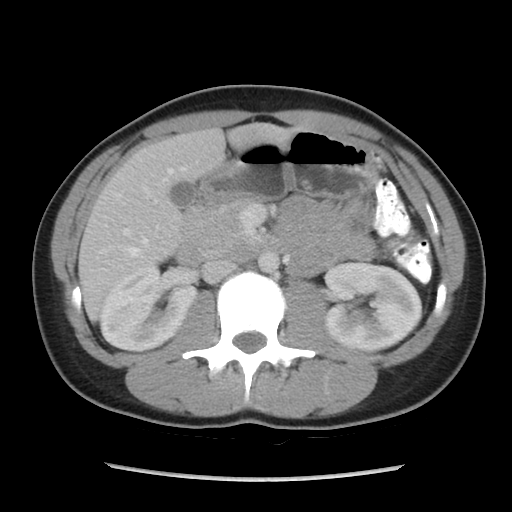
[im 56/88  lung]
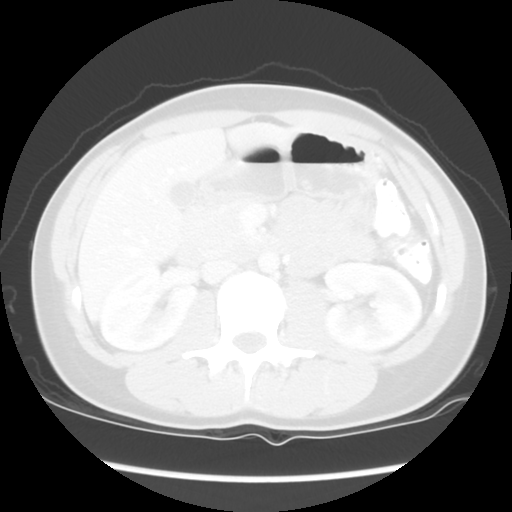
[im 64/88  soft-tissue]
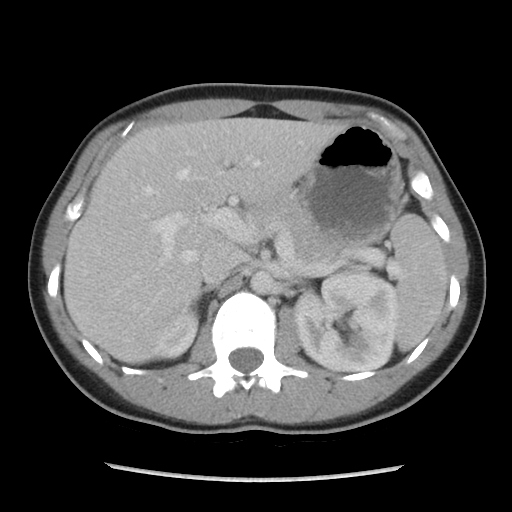
[im 64/88  lung]
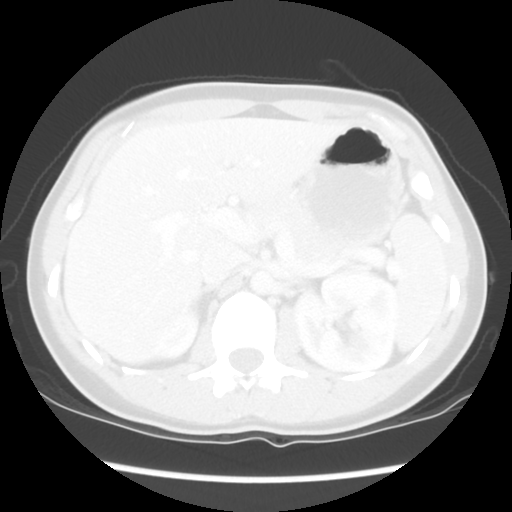
[im 72/88  soft-tissue]
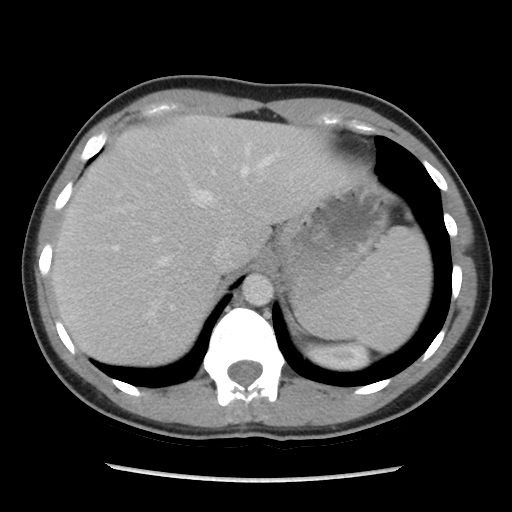
[im 72/88  lung]
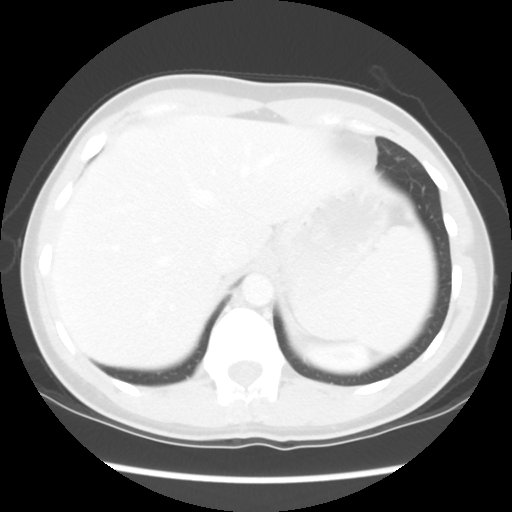
[im 72/88  bone]
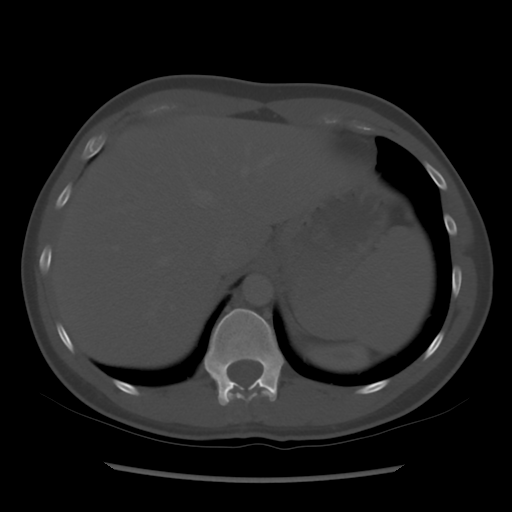
[im 80/88  soft-tissue]
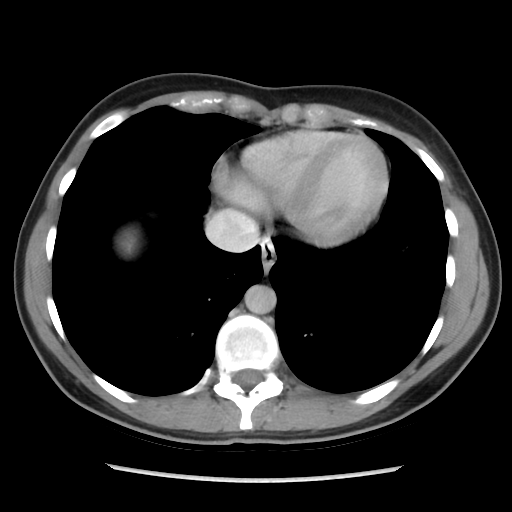
[im 80/88  lung]
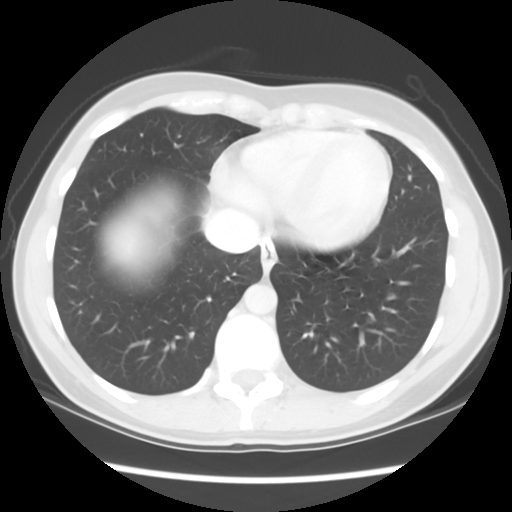

[Series 601: coronal body · coronal · 0.88mm/px · 1 of 103 slices shown, 2 images]
[im 35/103  soft-tissue]
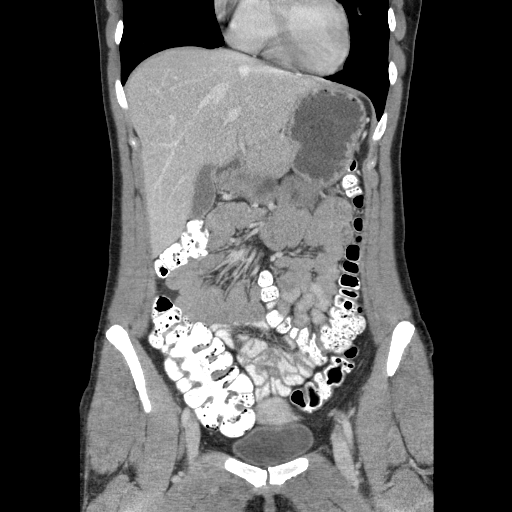
[im 35/103  bone]
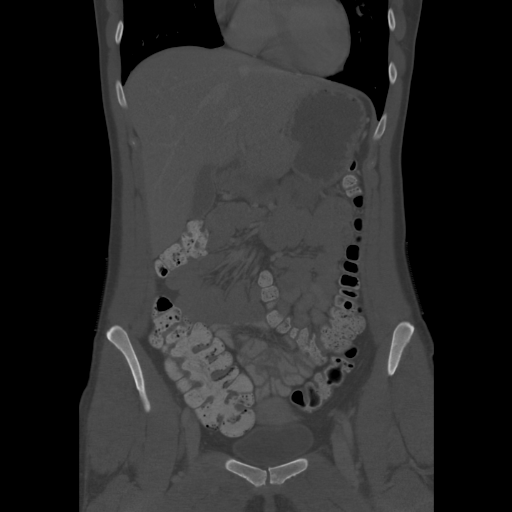

[11 of 36 positions shown; findings below may reference images not displayed]

FINDINGS: Lower chest: Unremarkable

Hepatobiliary: Mildly contracted gallbladder. Otherwise
unremarkable.

Pancreas: Unremarkable

Spleen: Unremarkable

Adrenals/Urinary Tract: Unremarkable

Stomach/Bowel: Appendix normal. Oral contrast extends through to the
rectum. No overt bowel wall thickening. Normal bowel caliber.

Vascular/Lymphatic: Unremarkable

Reproductive: Fluid density lesion of the left ovary 4.4 by 2.7 by
3.9 cm (volume = 24 cm^3).

On the bottom most image there appears to be a small left Bartholin
cyst, measuring 1.3 by 0.8 cm in the visualized portion.

Other: No supplemental non-categorized findings.

Musculoskeletal: Central disc protrusion at L3-4 causing central
narrowing of the thecal sac. Mild disc bulge at L4-5. Advanced for
age degenerative facet arthropathy at L5-S1 contributing to
borderline right foraminal stenosis at this level.
IMPRESSION: 1. A specific cause for right lower quadrant abdominal pain is not
identified. The appendix appears normal.
2. Advanced for age degenerative disc disease and lower lumbar
spondylosis, with a central disc protrusion at L3-4 causing central
narrowing of the thecal sac, a disc bulge at L4-5, and borderline
right foraminal stenosis at L5-S1 due to facet arthropathy.
3. 4.4 cm cyst of the left ovary.
4. Small left Bartholin cyst.

## 2020-02-20 IMAGING — DX DG CHEST 2V
2 series · 2 of 2 positions shown · non-contrast
Comparison: Overlapping portion of CT abdomen from 05/16/2017

CLINICAL DATA: Shortness of breath.  Upper respiratory infection.

EXAM:
CHEST - 2 VIEW

[chest pa]
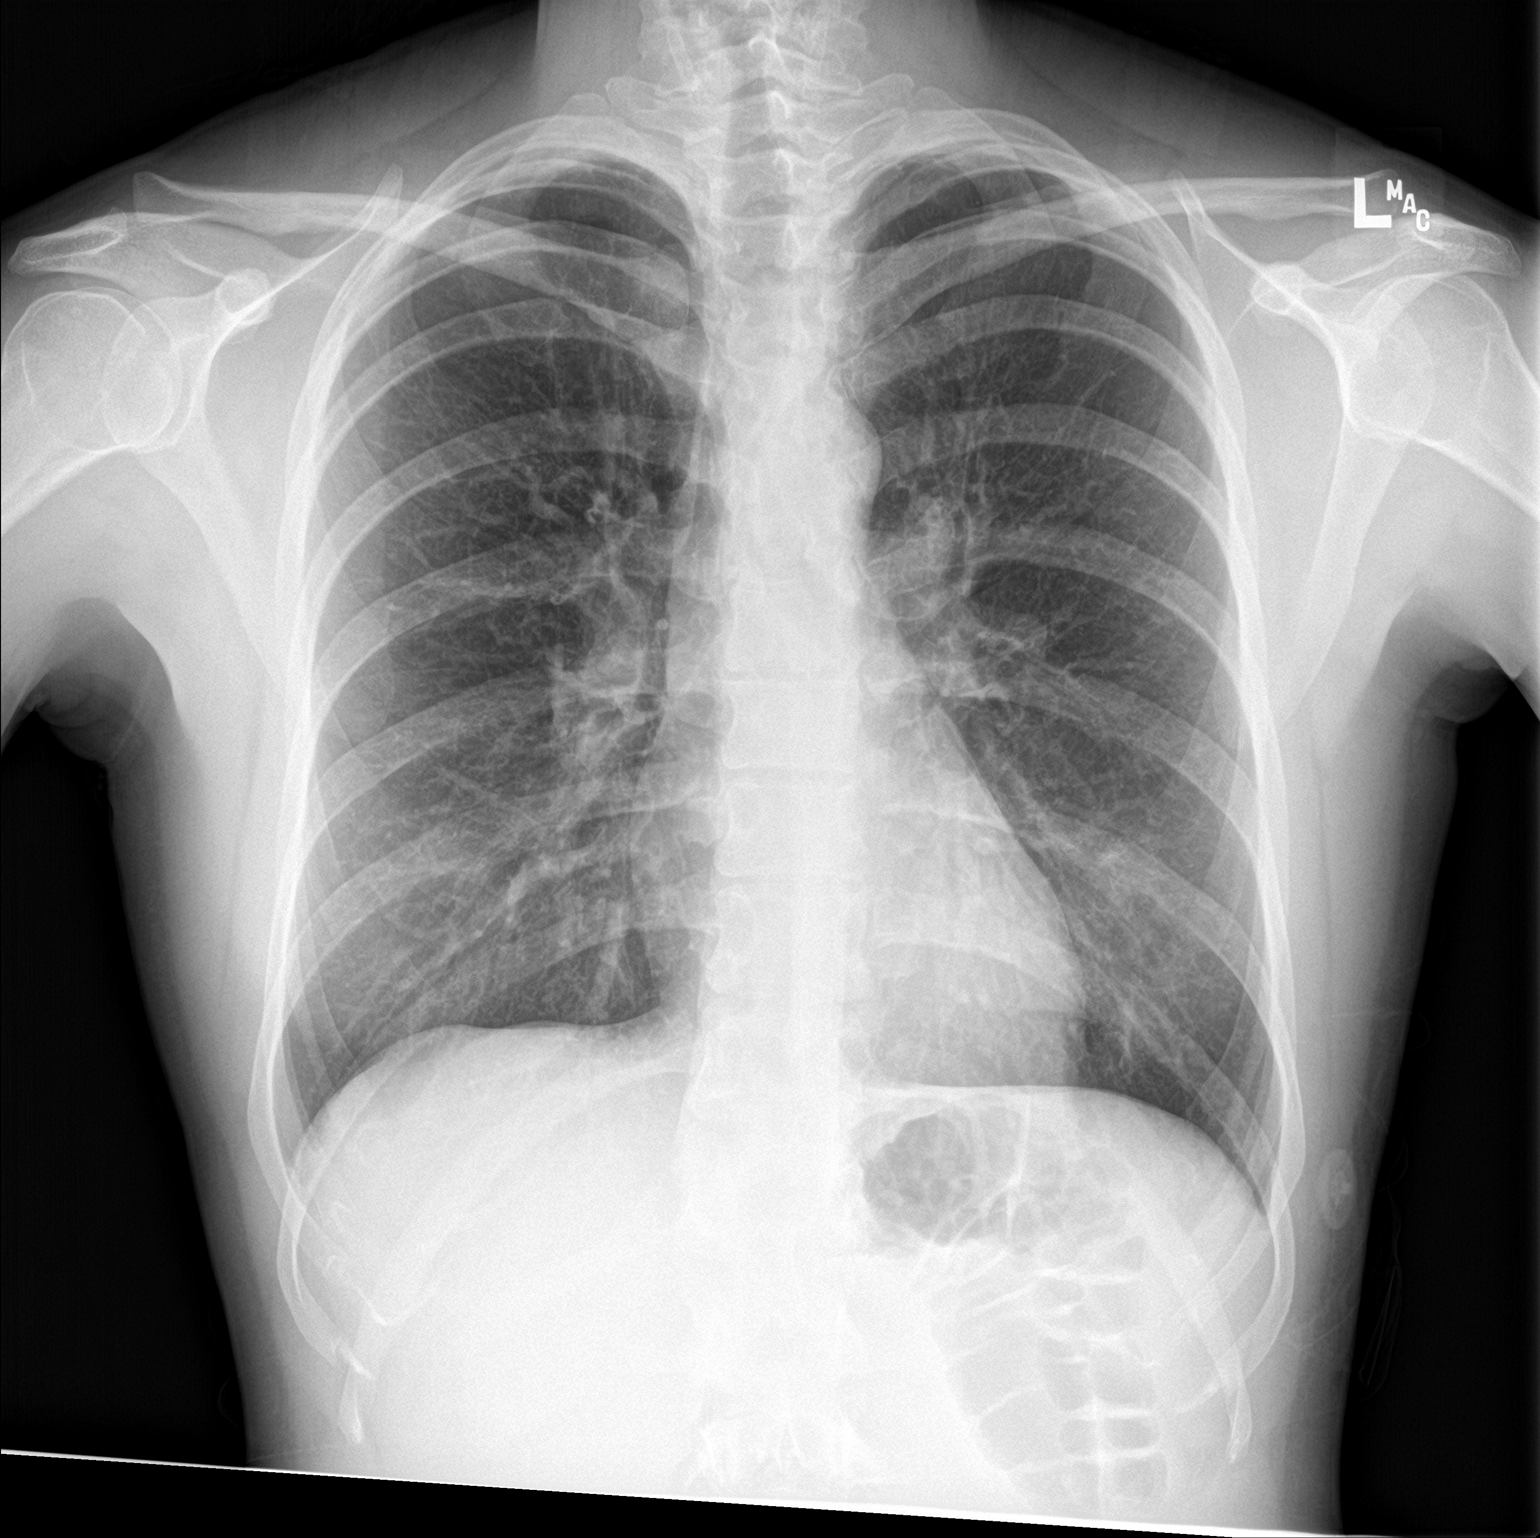

[chest lat]
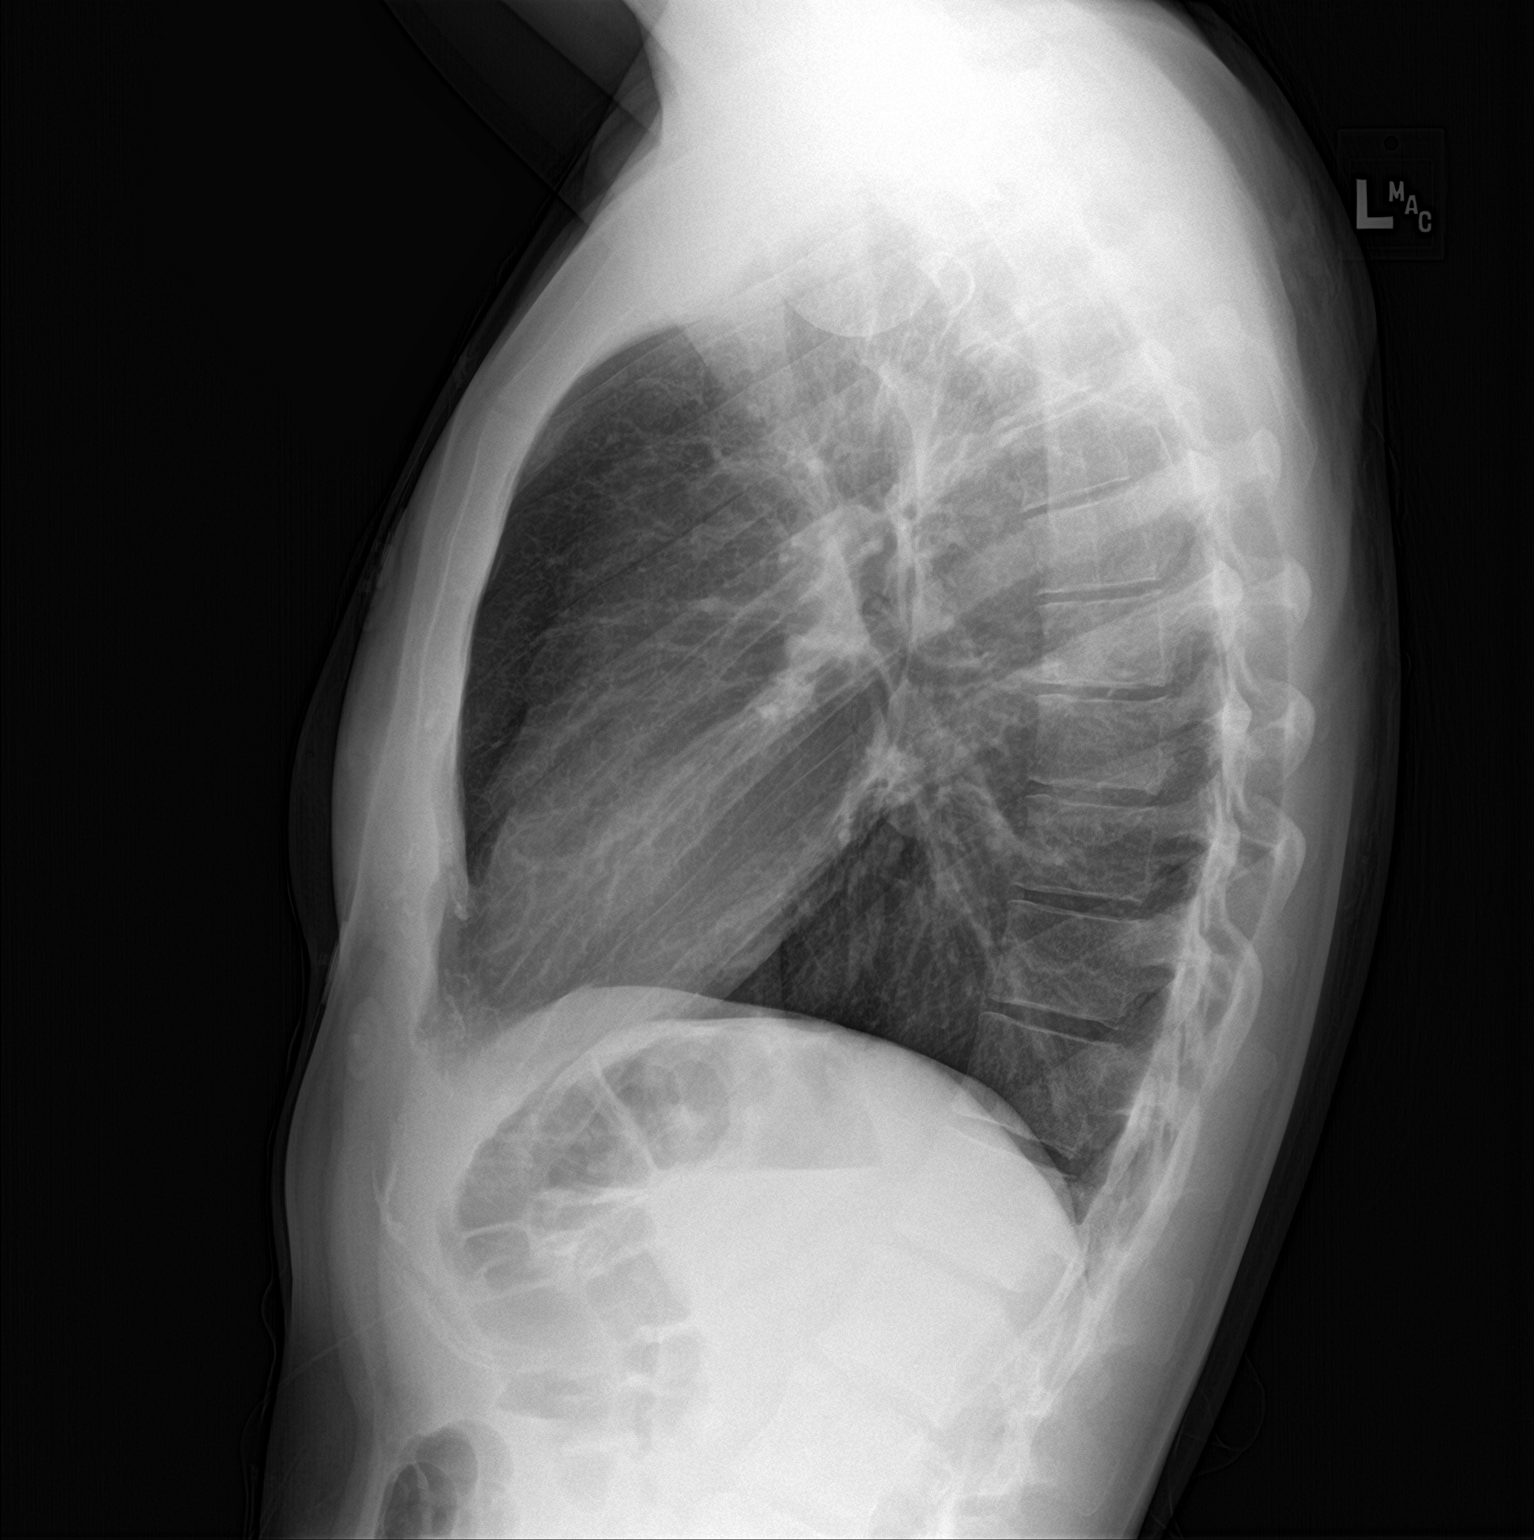

[2 of 2 positions shown; findings below may reference images not displayed]

FINDINGS: Mild central airway thickening. The lungs appear otherwise clear.
Cardiac and mediastinal margins appear normal. No pleural effusion.
IMPRESSION: 1. Airway thickening is present, suggesting bronchitis or reactive
airways disease.
